# Patient Record
Sex: Female | Born: 1935 | Race: White | Hispanic: No | Marital: Married
Health system: Southern US, Community
[De-identification: ages and names within clinical notes are randomized; demographics above are authoritative.]

---

## 2001-04-17 ENCOUNTER — Encounter: Payer: Self-pay | Admitting: Specialist

## 2001-04-24 ENCOUNTER — Inpatient Hospital Stay (HOSPITAL_COMMUNITY): Admission: RE | Admit: 2001-04-24 | Discharge: 2001-04-29 | Payer: Self-pay | Admitting: Specialist

## 2007-06-12 ENCOUNTER — Encounter (INDEPENDENT_AMBULATORY_CARE_PROVIDER_SITE_OTHER): Payer: Self-pay | Admitting: Oncology

## 2007-06-12 ENCOUNTER — Other Ambulatory Visit: Admission: RE | Admit: 2007-06-12 | Discharge: 2007-06-12 | Payer: Self-pay | Admitting: Oncology

## 2007-08-04 ENCOUNTER — Ambulatory Visit: Payer: Self-pay | Admitting: Oncology

## 2011-03-26 NOTE — H&P (Signed)
[facility]  Patient:    Joyce Hahn, Joyce Hahn                   MRN: [id number] Adm. Date:  04/24/01 Attending:  Philips J. Montez Morita, M.D. Dictator:   Druscilla Brownie. Shela Nevin, P.A. CC:         John D. Sheria Lang, M.D.; [facility], [address] [place], [place] [address]                         History and Physical  DATE OF BIRTH:  [birth year]  CHIEF COMPLAINT:  Pain in my left knee.  HISTORY OF PRESENT ILLNESS:  This is a 77 year old lady who had been seen by [place] for continuing progressive problems concerning her left knee.  She underwent left knee arthroscopy for meniscal surgery in December 1999.  She had been taking anti-inflammatories switching from Celebrex to ibuprofen for relief.  Unfortunately, she has come to this point in time where she has difficulty in ambulation.  She has to use a cane for walking.  X-rays have shown bone on bone contact of the knee and she is developing a varus deformity.  We tried some cosamin DS for the arthritis but unfortunately none of this has helped.  She has also had intra-articular steroid injections with little relief.  It was decided that this patient would benefit from total knee replacement arthroplasty to the left knee and she is admitted for same.  She has donated one unit of autologous blood for this procedure.  The patient has been cleared by Dr. Beulah Gandy. Sheria Lang of [employer].  PAST MEDICAL HISTORY:  Medically this patient has peripheral neuropathy of a chronic type, macular degeneration under control with ocuvite, and hypertension.  PAST SURGICAL HISTORY:  Hysterectomy in 1979, lumbar laminectomy in 1996, cholecystectomy in 1997, left knee arthroscopy in December 1999.  CURRENT MEDICATIONS:  1. Hyzaar 50/12.5 one q.d.  2. Neurontin 100 mg one q.a.m., one at noon.  3. Neurontin 300 mg two at h.s.  4. Estradiol one q.d.  5. Allegra D 60-120 ER one b.i.d.  6.  Amitriptyline hydrochloride 25 mg one at h.s.  7. Calcium D.  8. Furosemide 20 mg p.r.n. swelling.  9. Ocuvite. 10. Centrum Silver.  ALLERGIES:  CODEINE, CLINORIL, CORTISONE.  SOCIAL HISTORY:  Dr. Jonny Ruiz D. Sheria Lang of [employer] [place] her [place]  The patient is retired.  Lives with her husband.  Neither smokes nor drinks.  FAMILY HISTORY:  Positive for heart attacks and a stroke in the father and Alzheimers and a stroke in the mother.  REVIEW OF SYSTEMS:  CNS:  No seizure disorder.  No paralysis.  The patient does have numbness in the lower extremities from, she describes, about mid calf down from her peripheral neuropathy.  CARDIOVASCULAR:  No chest pain, angina, orthopnea.  RESPIRATORY:  No procedure cough, hemoptysis, shortness of breath.  GASTROINTESTINAL:  No nausea, vomiting, melena, or bloody stools. GENITOURINARY:  No discharge, dysuria, hematuria.  MUSCULOSKELETAL:  Primarily in present illness with her left knee.  PHYSICAL EXAMINATION  GENERAL:  Alert, cooperative, friendly, somewhat obese 77 year old white female who walks with a cane in her right hand.  She is accompanied by her husband.  VITAL SIGNS:  Blood pressure 148/86, pulse 80 and regular, respirations 12 and unlabored.  HEENT:  Normocephalic.  PERRLA.  Oropharynx is clear.  CHEST:  Clear to auscultation.  No rhonchi, rales, or wheezes.  HEART:  Regular rate and rhythm.  No murmurs are heard.  ABDOMEN:  Soft, nontender.  Liver, spleen:  Not felt.  GENITALIA:  Not done.  Not pertinent to present illness.  RECTAL:  Not done.  Not pertinent to present illness.  PELVIC:  Not done.  Not pertinent to present illness.  BREASTS:  Not done.  Not pertinent to present illness.  EXTREMITIES:  Patient does have a mild varus deformity to the left knee.  She has crepitus with range of motion.  The knee is full and somewhat boggy with synovitis.  ADMITTING  DIAGNOSES: 1. Severe osteoarthritis with bone on bone deformity left knee. 2. Hypertension. 3. Peripheral neuropathy. 4. Macular degeneration.  PLAN:  The patient will be admitted for total knee replacement arthroplasty of the left knee.  She has donated 1 unit of autologous blood for this procedure. We plan to have home physical therapy with ______ system and she has a specific therapist in mind and I told her to take that name to the hospital with her and discuss that with discharge planner. DD:  04/17/01 TD:  04/17/01 Job: [id number]

## 2011-03-26 NOTE — Op Note (Signed)
[facility]  Patient:    Joyce Hahn, Joyce Hahn                   MRN: [id number] Proc. Date: 04/24/01 Adm. Date:  [id number] Attending:  Rocky Crafts                           Operative Report  PREOPERATIVE DIAGNOSIS:  POSTOPERATIVE DIAGNOSIS:  OPERATION:  SURGEON:  Philips J. Montez Morita, M.D.  ASSISTANT:  1. Illene Labrador. Aplington, M.D.             2. Terie Purser L. Shela Nevin, P.A.  DESCRIPTION OF PROCEDURE:  After suitable general anesthesia, the left leg is prepped and draped routinely  after exsanguination and upper thigh tourniquet is inflated to 350 mmHg.  After about 10 or 15 minutes of surgery, the oozing necessitated upping the pressure to 380 mmHg.  A midline incision was made followed by a medial parapatella incision to dislocate the patella laterally. The medial side of the knee was exposed, and the medial prior ligament was stripped, allowing external rotation of the tibia and the medial meniscus freed up.  Significant arthritic deformity change over the medial side.  The lateral meniscus was exposed but not removed.  The anterior cruciate ligament was released.  At this point, I elected to go ahead and cut the distal femur, and a drill hole was made just above the PCL origin, and a finding rod was inserted followed by the alignment rod and elected to remove 8 mm of bone off the distal femur because she had a little bit of hyperextension to start with. So, the distal cutting jig was inserted, and an 8 mm cut was made off the distal femur.  We then used the epicondylar axis for the alignment because of the wear off the medial femoral condyle posteriorly, and the alignment jig approximated it.  A sizing guide was then brought in.  A 5 was a little too small; a 7 was a little too big.  I put the setting on size 6 and cut with the 5, cutting the anterior and posterior and then the ______ , and then the anterior and posterior chamfers.  This was  done with the [facility] posterior cruciate retaining system.  As the posterior cruciate appeared quite good, and she had a little recurvatum to start with, it was felt that we would save the posterior cruciate.  The tibia was then brought forward.  The menisci were removed.  The tibial spine was cut off, and the sizing guide looked like it would either be a 3 or 5 tibia; 3 fit better at this time, and we used it to drill the central hole in the tibia and brought in the medium intramedullary alignment guide.  Elected to do a 0 cut rather than a 5 degree posterior because of her recurvatum and cut 10 mm off the normal lateral side as a guide.  After cutting that, the backs of the femur were cleared of debris and spurring on the medial side.  The end plates were again tried on the tibia, and it was felt that a 5 tibia was ideal and not the 3 after the cut had been made.  The size 5 femur trial was then inserted and the size 5 tibia with the 12 mm tray which appeared ideal in both flexion and extension, perfect for the flexion gap.  There was no give medially  and laterally and nice full flexion without liftoff.  The alignment rod was placed down, and the ______ locked on the tibial tray.  Before doing this, we then looked at the patella.  The patella appeared to be quite thin.  We measured the overall thickness of the patella at 20 mm, felt that a recessed patella which would take 10 mm of patella was too much.  I then elected to use a resurfacing patella.  It looked like the 3 cm rather than a 5 cm would be ideal.  Needed to take off enough patella to leave about 13 mm; so we took off about 7 mm. This was going to be an 8 mm thick patella.  The 3 appeared to be the ideal. We drilled the holes for the 3, did a trial, and it looked quite good.  A minimal lateral release was done to make sure that this patella was way to the medial side.  We then removed the tibia and femur and cut the  groove for the tibia to accept the size 5 tibial tray.  Cement was then mixed, and all components were irrigated and cleansed, and then we cemented the tibia first and then the femur.  A problem arose as the 3 cm smallest patella resurfacing had been dropped somewhere along.  There was no other 3 available; so a 5 was brought in.  First we used the same drill holes, just a little bit larger, and it was cemented in place.  After the cement had set, everything was checked, and it seemed to be perfectly okay; so we elected to accept it as a nice riding patella.  The hole in the femur was filled in with bone that had been taken off the anterior chamfer.  The trial tibial tray was removed.  The back of the femurs looked okay.  A 12 mm poly insert was inserted, reduced. Stability was again excellent and flexion extension, medial and lateral stability.  The patella gliding very nicely in the groove.  Routine closure then with a synovial closing running and then interrupted of #1 PDS, 2-0 Vicryl in the subcutaneous tissue, and a running Monocryl with Steri-Strips in the skin.  The tourniquet was let down before closure was completed at 2 hours; 20 cc of Marcaine plain was put in at the end a good bit over the medial side where the medial release had been done, some about the incision, some in the joint.  Nice compression dressing.  Toes were nice and pink at the end.  She goes to recovery in good condition. DD:  04/24/01 TD:  04/24/01 Job: [id number]

## 2011-03-26 NOTE — Discharge Summary (Signed)
[facility]  Patient:    Joyce Hahn, Joyce Hahn                   MRN: [id number] Adm. Date:  [id number] Disc. Date: [id number] Attending:  Montez Morita, Philips John Dictator:   Ralene Bathe, P.A.                           Discharge Summary  ADMISSION DIAGNOSES: 1. End-stage osteoarthritis of left knee. 2. History of peripheral neuropathy. 3. Macular degeneration. 4. Hypertension.  DISCHARGE DIAGNOSES: 1. End-stage osteoarthritis of left knee. 2. History of peripheral neuropathy. 3. Macular degeneration. 4. Hypertension. 5. Postoperative hyponatremia, resolved. 6. Postoperative hyperkalemia, resolved with treatment. 7. Mild postoperative hemorrhagic anemia not requiring transfusion.  OPERATIONS:  Left total knee arthroplasty.  Surgeon:  [employer]. Montez Morita, M.D. Assistants:  Illene Labrador. Aplington, M.D., and Druscilla Brownie. Shela Nevin, P.A. Under general anesthesia.  CONSULTS:  None.  BRIEF HISTORY:  Joyce Hahn is a 77 year old female well known to our practice who has had osteoarthritis of her left knee.  She has failed conservative measures, having significant pain and dysfunction.  Therefore, she wished to proceed as was indicated for a total knee replacement.  The risks and benefits were discussed and she has agreed.  HOSPITAL COURSE:  The patient was admitted and underwent the above-named procedure and tolerated this well.  All appropriate IV antibiotics and analgesics were utilized.  Postoperatively, she was placed on DVT and PE prophylaxis with Coumadin.  She was also placed in physical therapy total knee replacement protocol with weightbearing as tolerated.  The patient did extremely well postoperatively and was weaned off of IV analgesics to p.o. medications.  She did experience a hyponatremia which responded with fluid restriction and discontinuation of her IV fluids.  She did have hypokalemia which had to be supplemented with oral medications, as well  as a run of IV potassium.  By her discharge date on April 29, 2001, her potassium had gone from 2.5 to 3.4.  Her admission potassium was 4.0.  I did send her home on oral potassium supplements with instructions to follow up with her medical doctor regarding this.  She was on a diuretic/antihypertensive combination. All in all the patient did extremely well and met her therapy goals.  She was stable for discharge to home with home health PT and R.N.  In regards to her knee, she had minimal drainage and minimal swelling.  The patient did have mild hemarthrosis and Philips J. Montez Morita, M.D., felt it in her best interest to discontinue the Coumadin to avoid any further knee swelling.  She was then switched to Ascription for DVT and PE prophylaxis.  At the time of discharge, the incision was dry, she was afebrile, her vital signs  were stable, and the patient was medically and orthopedically stable for discharge home.  LABORATORY DATA:  The admission hemoglobin was 13.6 and postoperatively 11.6, 9.7, and 9.9.  INRs monitored by pharmacy on Coumadin for DVT and PE prophylaxis.  Chemistries on admission were within normal limits. Postoperatively, she had a sodium of 131 on April 27, 2001, which resolved to normal at 138 on the following day.  Postoperatively, she had potassiums of 2.5, 2.6, 2.6, and on the day of discharge, 3.4.  Glucoses were mildly elevated, however, these were not fasting.  The urinalysis was negative on admission.  Blood type showed O-.  The EKG was normal on admission.  X-rays showed  osteoarthritis of the left knee.  A chest x-ray showed no acute cardiac or pulmonary process on preoperative date April 17, 2001.  CONDITION ON DISCHARGE:  Stable and improved.  DISCHARGE MEDICATIONS AND INSTRUCTIONS:  The patient is being discharged to home.  Home health PT and R.N. have been arranged.  I have actually ordered one additional BMET to be faxed to our office.  I sent her home on  K-Dur.  She will take this 20 mEq q.d.  Also, she was discontinued off of Coumadin to Ascriptin one b.i.d. at the time of discharge.  She was given Percocet one to two every four to six hours p.r.n. pain and Robaxin one every eight hours p.r.n. spasm.  Follow up with her primary physician in one to two weeks. Follow up in our office at two weeks postoperatively; call for a time. DD:  05/17/01 TD:  05/17/01 Job: [id number]

## 2015-08-27 DIAGNOSIS — D473 Essential (hemorrhagic) thrombocythemia: Secondary | ICD-10-CM

## 2015-09-10 DIAGNOSIS — D6949 Other primary thrombocytopenia: Secondary | ICD-10-CM | POA: Diagnosis not present

## 2015-10-14 DIAGNOSIS — D473 Essential (hemorrhagic) thrombocythemia: Secondary | ICD-10-CM | POA: Diagnosis not present

## 2016-04-16 DIAGNOSIS — D473 Essential (hemorrhagic) thrombocythemia: Secondary | ICD-10-CM | POA: Diagnosis not present

## 2016-08-16 DIAGNOSIS — D473 Essential (hemorrhagic) thrombocythemia: Secondary | ICD-10-CM | POA: Diagnosis not present

## 2016-10-20 DIAGNOSIS — D473 Essential (hemorrhagic) thrombocythemia: Secondary | ICD-10-CM | POA: Diagnosis not present

## 2017-02-21 DIAGNOSIS — D473 Essential (hemorrhagic) thrombocythemia: Secondary | ICD-10-CM | POA: Diagnosis not present

## 2017-05-23 DIAGNOSIS — Z7982 Long term (current) use of aspirin: Secondary | ICD-10-CM | POA: Diagnosis not present

## 2017-05-23 DIAGNOSIS — D473 Essential (hemorrhagic) thrombocythemia: Secondary | ICD-10-CM | POA: Diagnosis not present

## 2017-08-30 ENCOUNTER — Other Ambulatory Visit: Payer: Self-pay | Admitting: Physical Medicine and Rehabilitation

## 2017-08-30 DIAGNOSIS — M47816 Spondylosis without myelopathy or radiculopathy, lumbar region: Secondary | ICD-10-CM

## 2017-09-16 ENCOUNTER — Ambulatory Visit
Admission: RE | Admit: 2017-09-16 | Discharge: 2017-09-16 | Disposition: A | Discharge status: Home / Self Care | Payer: Medicare Other | Source: Ambulatory Visit | Attending: Physical Medicine and Rehabilitation | Admitting: Physical Medicine and Rehabilitation

## 2017-09-16 DIAGNOSIS — M47816 Spondylosis without myelopathy or radiculopathy, lumbar region: Secondary | ICD-10-CM

## 2017-09-16 MED ORDER — METHYLPREDNISOLONE ACETATE 40 MG/ML INJ SUSP (RADIOLOG
120.0000 mg | Freq: Once | INTRAMUSCULAR | Status: AC
  Administered 2017-09-16: 120 mg via EPIDURAL

## 2017-09-16 MED ORDER — IOPAMIDOL (ISOVUE-M 200) INJECTION 41%
1.0000 mL | Freq: Once | INTRAMUSCULAR | Status: AC
  Administered 2017-09-16: 1 mL via EPIDURAL

## 2017-09-16 NOTE — Discharge Instructions (Signed)

## 2017-12-05 DIAGNOSIS — D473 Essential (hemorrhagic) thrombocythemia: Secondary | ICD-10-CM | POA: Diagnosis not present

## 2018-01-17 DIAGNOSIS — I10 Essential (primary) hypertension: Secondary | ICD-10-CM

## 2018-01-17 DIAGNOSIS — J9621 Acute and chronic respiratory failure with hypoxia: Secondary | ICD-10-CM | POA: Diagnosis not present

## 2018-01-17 DIAGNOSIS — R6521 Severe sepsis with septic shock: Secondary | ICD-10-CM | POA: Diagnosis not present

## 2018-01-17 DIAGNOSIS — A419 Sepsis, unspecified organism: Secondary | ICD-10-CM | POA: Diagnosis not present

## 2018-01-17 DIAGNOSIS — J690 Pneumonitis due to inhalation of food and vomit: Secondary | ICD-10-CM | POA: Diagnosis not present

## 2018-01-17 DIAGNOSIS — I350 Nonrheumatic aortic (valve) stenosis: Secondary | ICD-10-CM | POA: Diagnosis not present

## 2018-01-18 DIAGNOSIS — A419 Sepsis, unspecified organism: Secondary | ICD-10-CM | POA: Diagnosis not present

## 2018-01-18 DIAGNOSIS — J690 Pneumonitis due to inhalation of food and vomit: Secondary | ICD-10-CM | POA: Diagnosis not present

## 2018-01-18 DIAGNOSIS — J9621 Acute and chronic respiratory failure with hypoxia: Secondary | ICD-10-CM | POA: Diagnosis not present

## 2018-01-18 DIAGNOSIS — R6521 Severe sepsis with septic shock: Secondary | ICD-10-CM | POA: Diagnosis not present

## 2018-01-19 DIAGNOSIS — A419 Sepsis, unspecified organism: Secondary | ICD-10-CM | POA: Diagnosis not present

## 2018-01-19 DIAGNOSIS — J690 Pneumonitis due to inhalation of food and vomit: Secondary | ICD-10-CM | POA: Diagnosis not present

## 2018-01-19 DIAGNOSIS — R6521 Severe sepsis with septic shock: Secondary | ICD-10-CM | POA: Diagnosis not present

## 2018-01-19 DIAGNOSIS — J9621 Acute and chronic respiratory failure with hypoxia: Secondary | ICD-10-CM | POA: Diagnosis not present

## 2018-01-20 DIAGNOSIS — J9621 Acute and chronic respiratory failure with hypoxia: Secondary | ICD-10-CM | POA: Diagnosis not present

## 2018-01-20 DIAGNOSIS — R6521 Severe sepsis with septic shock: Secondary | ICD-10-CM | POA: Diagnosis not present

## 2018-01-20 DIAGNOSIS — J690 Pneumonitis due to inhalation of food and vomit: Secondary | ICD-10-CM | POA: Diagnosis not present

## 2018-01-20 DIAGNOSIS — A419 Sepsis, unspecified organism: Secondary | ICD-10-CM | POA: Diagnosis not present

## 2018-01-21 DIAGNOSIS — J690 Pneumonitis due to inhalation of food and vomit: Secondary | ICD-10-CM | POA: Diagnosis not present

## 2018-01-21 DIAGNOSIS — R6521 Severe sepsis with septic shock: Secondary | ICD-10-CM | POA: Diagnosis not present

## 2018-01-21 DIAGNOSIS — A419 Sepsis, unspecified organism: Secondary | ICD-10-CM | POA: Diagnosis not present

## 2018-01-21 DIAGNOSIS — J9621 Acute and chronic respiratory failure with hypoxia: Secondary | ICD-10-CM | POA: Diagnosis not present

## 2018-01-22 DIAGNOSIS — J189 Pneumonia, unspecified organism: Secondary | ICD-10-CM

## 2018-01-22 DIAGNOSIS — I248 Other forms of acute ischemic heart disease: Secondary | ICD-10-CM | POA: Diagnosis not present

## 2018-01-22 DIAGNOSIS — A419 Sepsis, unspecified organism: Secondary | ICD-10-CM | POA: Diagnosis not present

## 2018-01-22 DIAGNOSIS — I10 Essential (primary) hypertension: Secondary | ICD-10-CM | POA: Diagnosis not present

## 2018-01-22 DIAGNOSIS — N179 Acute kidney failure, unspecified: Secondary | ICD-10-CM | POA: Diagnosis not present

## 2018-01-22 DIAGNOSIS — J9601 Acute respiratory failure with hypoxia: Secondary | ICD-10-CM | POA: Diagnosis not present

## 2018-01-23 DIAGNOSIS — I248 Other forms of acute ischemic heart disease: Secondary | ICD-10-CM | POA: Diagnosis not present

## 2018-01-23 DIAGNOSIS — J9601 Acute respiratory failure with hypoxia: Secondary | ICD-10-CM | POA: Diagnosis not present

## 2018-01-23 DIAGNOSIS — N179 Acute kidney failure, unspecified: Secondary | ICD-10-CM | POA: Diagnosis not present

## 2018-01-23 DIAGNOSIS — A419 Sepsis, unspecified organism: Secondary | ICD-10-CM | POA: Diagnosis not present

## 2018-01-23 DIAGNOSIS — J189 Pneumonia, unspecified organism: Secondary | ICD-10-CM | POA: Diagnosis not present

## 2018-01-23 DIAGNOSIS — I10 Essential (primary) hypertension: Secondary | ICD-10-CM | POA: Diagnosis not present

## 2018-01-24 DIAGNOSIS — A419 Sepsis, unspecified organism: Secondary | ICD-10-CM | POA: Diagnosis not present

## 2018-01-24 DIAGNOSIS — J189 Pneumonia, unspecified organism: Secondary | ICD-10-CM | POA: Diagnosis not present

## 2018-01-24 DIAGNOSIS — J9601 Acute respiratory failure with hypoxia: Secondary | ICD-10-CM | POA: Diagnosis not present

## 2018-01-24 DIAGNOSIS — N179 Acute kidney failure, unspecified: Secondary | ICD-10-CM | POA: Diagnosis not present

## 2018-01-25 DIAGNOSIS — N179 Acute kidney failure, unspecified: Secondary | ICD-10-CM | POA: Diagnosis not present

## 2018-01-25 DIAGNOSIS — A419 Sepsis, unspecified organism: Secondary | ICD-10-CM | POA: Diagnosis not present

## 2018-01-25 DIAGNOSIS — I10 Essential (primary) hypertension: Secondary | ICD-10-CM | POA: Diagnosis not present

## 2018-01-25 DIAGNOSIS — J9601 Acute respiratory failure with hypoxia: Secondary | ICD-10-CM | POA: Diagnosis not present

## 2018-01-25 DIAGNOSIS — I248 Other forms of acute ischemic heart disease: Secondary | ICD-10-CM | POA: Diagnosis not present

## 2018-01-25 DIAGNOSIS — J189 Pneumonia, unspecified organism: Secondary | ICD-10-CM | POA: Diagnosis not present

## 2018-02-06 DEATH — deceased

## 2018-07-22 IMAGING — XA Imaging study
2 series · 2 of 2 positions shown · non-contrast
Comparison: none

CLINICAL DATA: Lumbosacral spondylosis without myelopathy. LEFT leg
radicular symptoms.

[Series 1: ortho standard · 1 of 1 slices shown (1 of 2)]
[im 1/1]
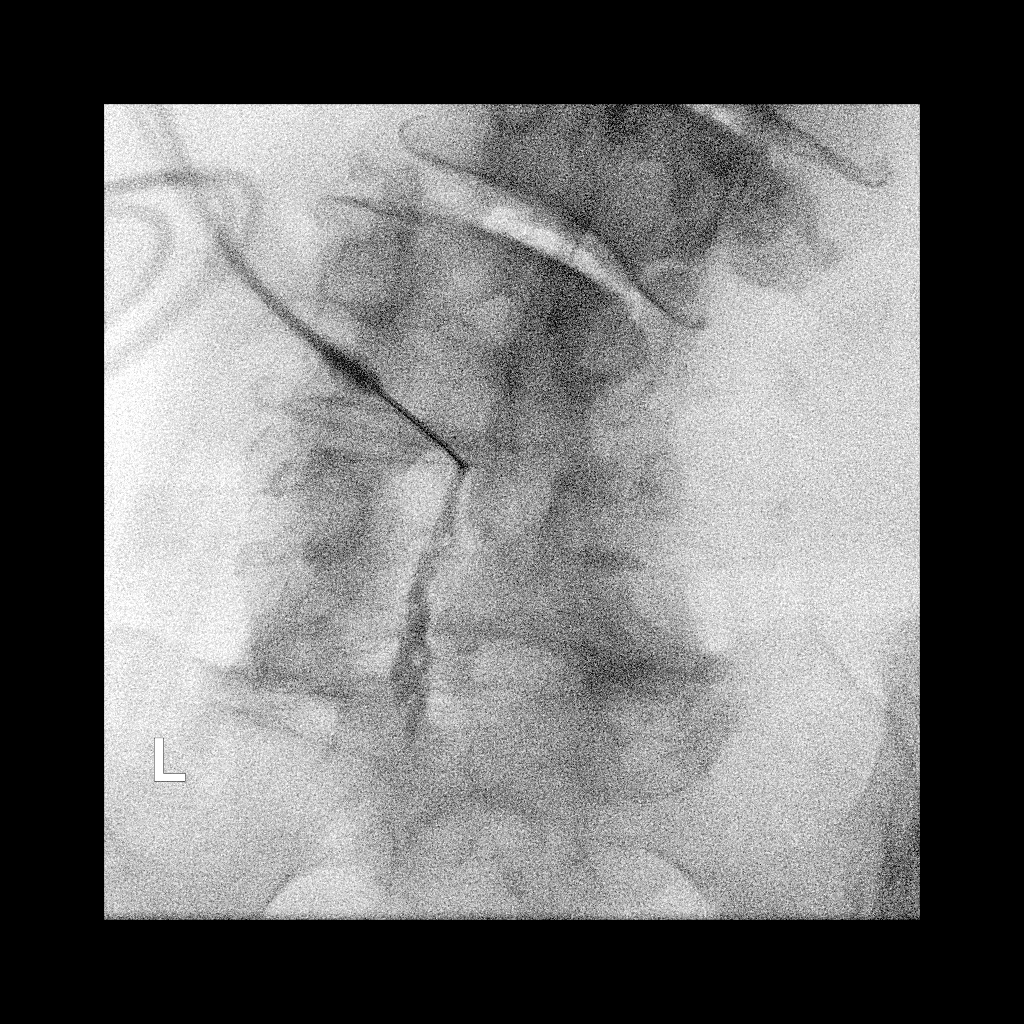

[Series 2: ortho standard · 1 of 1 slices shown (2 of 2)]
[im 1/1]
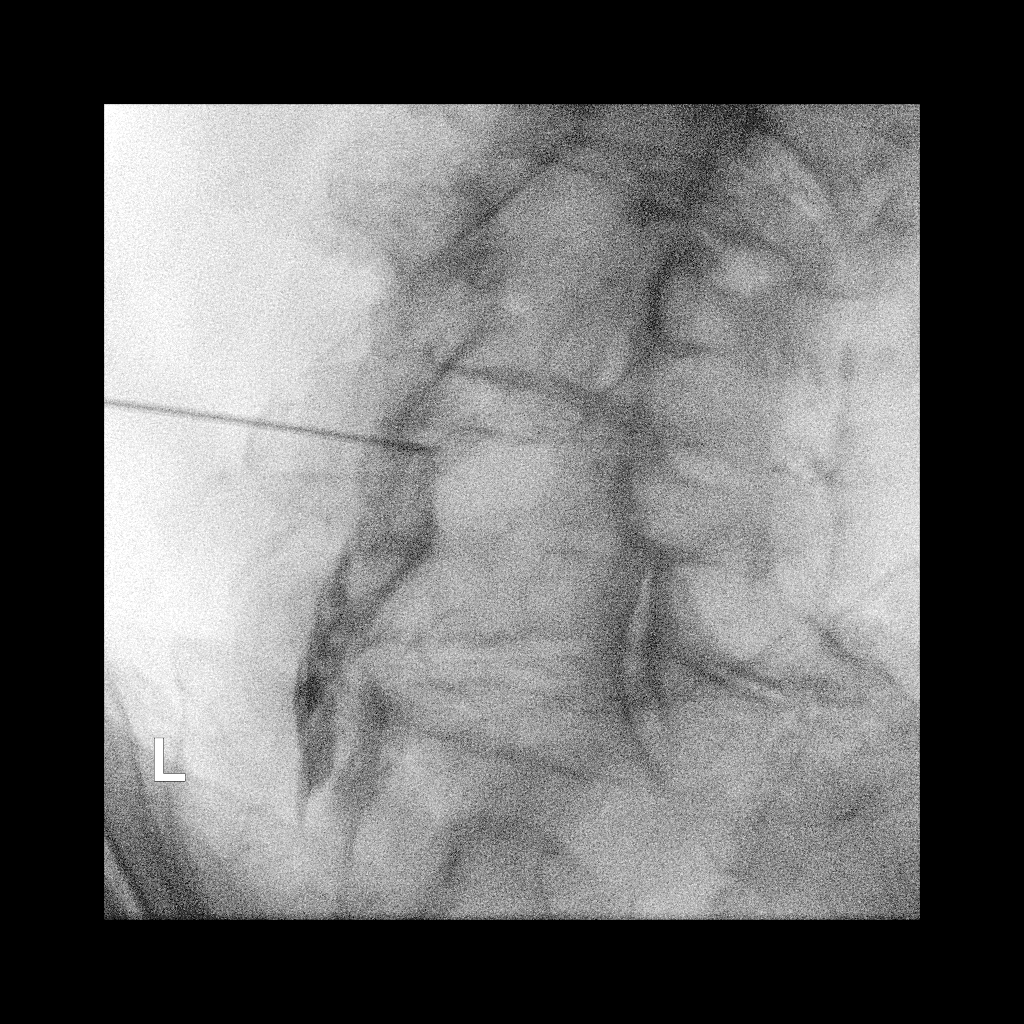

[2 of 2 positions shown; findings below may reference images not displayed]

FLUOROSCOPY TIME:  13 seconds corresponding to a Dose Area Product
of 15.59 ?Gy*m2

PROCEDURE:
The procedure, risks, benefits, and alternatives were explained to
the patient. Questions regarding the procedure were encouraged and
answered. The patient understands and consents to the procedure.

LUMBAR EPIDURAL INJECTION:

An interlaminar approach was performed on LEFT at L4-5. The
overlying skin was cleansed and anesthetized. A 20 gauge epidural
needle was advanced using loss-of-resistance technique.

DIAGNOSTIC EPIDURAL INJECTION:

Injection of Isovue-M 200 shows a good epidural pattern with spread
above and below the level of needle placement, primarily on the
LEFT; no vascular opacification is seen.

THERAPEUTIC EPIDURAL INJECTION:

120.0 Mg of Depo-Medrol mixed with 2 mL 1% lidocaine were instilled.
The procedure was well-tolerated, and the patient was discharged
thirty minutes following the injection in good condition.

COMPLICATIONS:
None.
IMPRESSION: Technically successful epidural injection on the LEFT L4-5 # 1.
